# Patient Record
Sex: Male | Born: 1995 | Race: Asian | Hispanic: No | Marital: Single | Smoking: Never smoker
Health system: Southern US, Community
[De-identification: ages and names within clinical notes are randomized; demographics above are authoritative.]

---
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
## 2014-09-24 ENCOUNTER — Encounter: Payer: Self-pay | Admitting: *Deleted

## 2014-09-24 ENCOUNTER — Emergency Department
Admission: EM | Admit: 2014-09-24 | Discharge: 2014-09-24 | Disposition: A | Discharge status: Home / Self Care | Payer: Self-pay | Attending: Emergency Medicine | Admitting: Emergency Medicine

## 2014-09-24 ENCOUNTER — Emergency Department: Payer: Self-pay

## 2014-09-24 DIAGNOSIS — S199XXA Unspecified injury of neck, initial encounter: Secondary | ICD-10-CM | POA: Insufficient documentation

## 2014-09-24 DIAGNOSIS — S99912A Unspecified injury of left ankle, initial encounter: Secondary | ICD-10-CM | POA: Insufficient documentation

## 2014-09-24 DIAGNOSIS — Y92410 Unspecified street and highway as the place of occurrence of the external cause: Secondary | ICD-10-CM | POA: Insufficient documentation

## 2014-09-24 DIAGNOSIS — Y9389 Activity, other specified: Secondary | ICD-10-CM | POA: Insufficient documentation

## 2014-09-24 DIAGNOSIS — Y998 Other external cause status: Secondary | ICD-10-CM | POA: Insufficient documentation

## 2014-09-24 DIAGNOSIS — Z87891 Personal history of nicotine dependence: Secondary | ICD-10-CM | POA: Insufficient documentation

## 2014-09-24 DIAGNOSIS — S4992XA Unspecified injury of left shoulder and upper arm, initial encounter: Secondary | ICD-10-CM | POA: Insufficient documentation

## 2014-09-24 MED ORDER — TRAMADOL HCL 50 MG PO TABS: 50.0000 mg | ORAL_TABLET | Freq: Four times a day (QID) | ORAL | Status: DC | PRN

## 2014-09-24 MED ORDER — METHOCARBAMOL 500 MG PO TABS
1000.0000 mg | ORAL_TABLET | Freq: Once | ORAL | Status: AC
  Administered 2014-09-24: 1000 mg via ORAL

## 2014-09-24 MED ORDER — METHOCARBAMOL 500 MG PO TABS
ORAL_TABLET | ORAL | Status: AC
  Administered 2014-09-24: 1000 mg via ORAL
  Filled 2014-09-24: qty 2

## 2014-09-24 MED ORDER — IBUPROFEN 800 MG PO TABS
ORAL_TABLET | ORAL | Status: AC
  Administered 2014-09-24: 800 mg via ORAL
  Filled 2014-09-24: qty 1

## 2014-09-24 MED ORDER — TRAMADOL HCL 50 MG PO TABS
ORAL_TABLET | ORAL | Status: AC
  Administered 2014-09-24: 50 mg via ORAL
  Filled 2014-09-24: qty 1

## 2014-09-24 MED ORDER — TRAMADOL HCL 50 MG PO TABS
50.0000 mg | ORAL_TABLET | Freq: Once | ORAL | Status: AC
  Administered 2014-09-24: 50 mg via ORAL

## 2014-09-24 MED ORDER — METHOCARBAMOL 750 MG PO TABS: 1500.0000 mg | ORAL_TABLET | Freq: Four times a day (QID) | ORAL | Status: DC

## 2014-09-24 MED ORDER — IBUPROFEN 800 MG PO TABS: 800.0000 mg | ORAL_TABLET | Freq: Three times a day (TID) | ORAL | Status: DC | PRN

## 2014-09-24 MED ORDER — IBUPROFEN 800 MG PO TABS
800.0000 mg | ORAL_TABLET | Freq: Once | ORAL | Status: AC
  Administered 2014-09-24: 800 mg via ORAL
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
## 2014-09-24 NOTE — Discharge Instructions (Signed)

## 2015-03-31 ENCOUNTER — Emergency Department: Payer: Worker's Compensation

## 2015-03-31 ENCOUNTER — Emergency Department
Admission: EM | Admit: 2015-03-31 | Discharge: 2015-03-31 | Disposition: A | Discharge status: Home / Self Care | Payer: Worker's Compensation | Attending: Emergency Medicine | Admitting: Emergency Medicine

## 2015-03-31 ENCOUNTER — Encounter: Payer: Self-pay | Admitting: Emergency Medicine

## 2015-03-31 DIAGNOSIS — Z87891 Personal history of nicotine dependence: Secondary | ICD-10-CM | POA: Diagnosis not present

## 2015-03-31 DIAGNOSIS — Y990 Civilian activity done for income or pay: Secondary | ICD-10-CM | POA: Diagnosis not present

## 2015-03-31 DIAGNOSIS — W208XXA Other cause of strike by thrown, projected or falling object, initial encounter: Secondary | ICD-10-CM | POA: Diagnosis not present

## 2015-03-31 DIAGNOSIS — Y9289 Other specified places as the place of occurrence of the external cause: Secondary | ICD-10-CM | POA: Diagnosis not present

## 2015-03-31 DIAGNOSIS — Z79899 Other long term (current) drug therapy: Secondary | ICD-10-CM | POA: Insufficient documentation

## 2015-03-31 DIAGNOSIS — Y9389 Activity, other specified: Secondary | ICD-10-CM | POA: Diagnosis not present

## 2015-03-31 DIAGNOSIS — S60221A Contusion of right hand, initial encounter: Secondary | ICD-10-CM | POA: Insufficient documentation

## 2015-03-31 DIAGNOSIS — S6991XA Unspecified injury of right wrist, hand and finger(s), initial encounter: Secondary | ICD-10-CM | POA: Diagnosis present

## 2015-03-31 MED ORDER — NAPROXEN 500 MG PO TABS: 500.0000 mg | ORAL_TABLET | Freq: Two times a day (BID) | ORAL | Status: DC

## 2015-03-31 NOTE — ED Notes (Signed)
States he had something drop on to right hand at work  having pain with min swelling to hand

## 2015-03-31 NOTE — ED Notes (Signed)
After patient was dismissed, I was asked by RN Marga HootsShannin had I taken vitals on patient and not recorded them.  I replied that I was not aware that vitals were needed that I was informed that I needed to do a workman's comp.  After searching the patient's labels were found on PA's desk and on the back was written FLEX, HT.,  WT., VS.

## 2015-03-31 NOTE — ED Provider Notes (Signed)
[facility] Emergency Department Provider Note  ____________________________________________  Time seen: Approximately 5:23 PM  I have reviewed the triage vital signs and the nursing notes.   HISTORY  Chief Complaint Hand Pain    HPI Eddie Stephenson is a 19 y.o. male patient complaining of right hand pain for 1 week secondary to contusion which occurred at work. Patient state pain has increased and the swelling is never resolved. Patient denies any loss of sensation. States pain increase with flexion. Patient has used Tylenol, Motrin, and has applied ice to the area.Patient rated his pain as a 4/10.   History reviewed. No pertinent past medical history.  There are no active problems to display for this patient.   History reviewed. No pertinent past surgical history.  Current Outpatient Rx  Name  Route  Sig  Dispense  Refill  . ibuprofen (ADVIL,MOTRIN) 800 MG tablet   Oral   Take 1 tablet (800 mg total) by mouth every 8 (eight) hours as needed for moderate pain.   15 tablet   0   . methocarbamol (ROBAXIN-750) 750 MG tablet   Oral   Take 2 tablets (1,500 mg total) by mouth 4 (four) times daily.   40 tablet   0   . traMADol (ULTRAM) 50 MG tablet   Oral   Take 1 tablet (50 mg total) by mouth every 6 (six) hours as needed for moderate pain.   12 tablet   0     Allergies Review of patient's allergies indicates no known allergies.  No family history on file.  Social History Social History  Substance Use Topics  . Smoking status: Former Games developer  . Smokeless tobacco: None  . Alcohol Use: No    Review of Systems Constitutional: No fever/chills Eyes: No visual changes. ENT: No sore throat. Cardiovascular: Denies chest pain. Respiratory: Denies shortness of breath. Gastrointestinal: No abdominal pain.  No nausea, no vomiting.  No diarrhea.  No constipation. Genitourinary: Negative for dysuria. Musculoskeletal: Negative for back pain. Skin:  Negative for rash. Neurological: Negative for headaches, focal weakness or numbness.  10-point ROS otherwise negative.  ____________________________________________   PHYSICAL EXAM:  VITAL SIGNS: ED Triage Vitals  Enc Vitals Group     BP --      Pulse --      Resp --      Temp --      Temp src --      SpO2 --      Weight --      Height --      Head Cir --      Peak Flow --      Pain Score 03/31/15 1655 4     Pain Loc --      Pain Edu? --      Excl. in GC? --     Constitutional: Alert and oriented. Well appearing and in no acute distress. Eyes: Conjunctivae are normal. PERRL. EOMI. Head: Atraumatic. Nose: No congestion/rhinnorhea. Mouth/Throat: Mucous membranes are moist.  Oropharynx non-erythematous. Neck: No stridor.  No cervical spine tenderness to palpation. Hematological/Lymphatic/Immunilogical: No cervical lymphadenopathy. Cardiovascular: Normal rate, regular rhythm. Grossly normal heart sounds.  Good peripheral circulation. Respiratory: Normal respiratory effort.  No retractions. Lungs CTAB. Gastrointestinal: Soft and nontender. No distention. No abdominal bruits. No CVA tenderness. Musculoskeletal: No lower extremity tenderness nor edema.  No joint effusions. Neurologic:  Normal speech and language. No gross focal neurologic deficits are appreciated. No gait instability. Skin:  Skin is warm, dry  and intact. No rash noted. Psychiatric: Mood and affect are normal. Speech and behavior are normal.  ____________________________________________   LABS (all labs ordered are listed, but only abnormal results are displayed)  Labs Reviewed - No data to display ____________________________________________  EKG   ____________________________________________  RADIOLOGY  No acute findings on x-ray of the right hand. ____________________________________________   PROCEDURES  Procedure(s) performed: None  Critical Care performed:  No  ____________________________________________   INITIAL IMPRESSION / ASSESSMENT AND PLAN / ED COURSE  Pertinent labs & imaging results that were available during my care of the patient were reviewed by me and considered in my medical decision making (see chart for details).  Right hand contusion. Discussed negative x-ray findings with the patient. Patient given home care instructions and a prescription for naproxen take as directed. Patient advised follow-up with family doctor as needed. ____________________________________________   FINAL CLINICAL IMPRESSION(S) / ED DIAGNOSES  Final diagnoses:  Hand contusion, right, initial encounter      Joni ReiningRonald K Raequon Catanzaro, PA-C 03/31/15 1802  Sharyn CreamerMark Quale, MD 03/31/15 [phone]

## 2015-03-31 NOTE — Discharge Instructions (Signed)
Hand Contusion ° A hand contusion is a deep bruise to the hand. Contusions happen when an injury causes bleeding under the skin. Signs of bruising include pain, puffiness (swelling), and discolored skin. The contusion may turn blue, purple, or yellow. °HOME CARE °· Put ice on the injured area. °¨ Put ice in a plastic bag. °¨ Place a towel between your skin and the bag. °¨ Leave the ice on for 15-20 minutes, 03-04 times a day. °· Only take medicines as told by your doctor. °· Use an elastic wrap only as told. You may remove the wrap for sleeping, showering, and bathing. Take the wrap off if you lose feeling (have numbness) in your fingers, or they turn blue or cold. Put the wrap on more loosely. °· Keep the hand raised (elevated) with pillows. °· Avoid using your hand too much if it is painful. °GET HELP RIGHT AWAY IF:  °· You have more redness, puffiness, or pain in your hand. °· Your puffiness or pain does not get better with medicine. °· You lose feeling in your hand, or you cannot move your fingers. °· Your hand turns cold or blue. °· You have pain when you move your fingers. °· Your hand feels warm. °· Your contusion does not get better in 2 days. °MAKE SURE YOU:  °· Understand these instructions. °· Will watch this condition. °· Will get help right away if you are not doing well or you get worse. °  °This information is not intended to replace advice given to you by your health care provider. Make sure you discuss any questions you have with your health care provider. °  °Document Released: 09/15/2007 Document Revised: 04/19/2014 Document Reviewed: 09/20/2011 °Elsevier Interactive Patient Education ©2016 [employer] Inc[employer] ° °

## 2015-03-31 NOTE — ED Notes (Signed)
After patient was discharged I was asked by RN Marga HootsShannin had I gotten vitals on Patient.  I told her I was not aware vitals were were not taken  I was informed that a workman's comp was to be done.  In looking to see if they had been done and not recorded on the PA's desk were patient's stickers and on the back was written, Flex, HT. WT. VS.

## 2015-04-21 ENCOUNTER — Encounter: Payer: Self-pay | Admitting: Emergency Medicine

## 2015-04-21 ENCOUNTER — Emergency Department
Admission: EM | Admit: 2015-04-21 | Discharge: 2015-04-21 | Disposition: A | Discharge status: Home / Self Care | Payer: Self-pay | Attending: Emergency Medicine | Admitting: Emergency Medicine

## 2015-04-21 DIAGNOSIS — X501XXA Overexertion from prolonged static or awkward postures, initial encounter: Secondary | ICD-10-CM | POA: Insufficient documentation

## 2015-04-21 DIAGNOSIS — Z87891 Personal history of nicotine dependence: Secondary | ICD-10-CM | POA: Insufficient documentation

## 2015-04-21 DIAGNOSIS — Y9289 Other specified places as the place of occurrence of the external cause: Secondary | ICD-10-CM | POA: Insufficient documentation

## 2015-04-21 DIAGNOSIS — Y9389 Activity, other specified: Secondary | ICD-10-CM | POA: Insufficient documentation

## 2015-04-21 DIAGNOSIS — Y990 Civilian activity done for income or pay: Secondary | ICD-10-CM | POA: Insufficient documentation

## 2015-04-21 DIAGNOSIS — S39012A Strain of muscle, fascia and tendon of lower back, initial encounter: Secondary | ICD-10-CM | POA: Insufficient documentation

## 2015-04-21 MED ORDER — METHOCARBAMOL 500 MG PO TABS: 500.0000 mg | ORAL_TABLET | Freq: Four times a day (QID) | ORAL | Status: DC

## 2015-04-21 MED ORDER — MELOXICAM 15 MG PO TABS: 15.0000 mg | ORAL_TABLET | Freq: Every day | ORAL | Status: DC

## 2015-04-21 NOTE — Discharge Instructions (Signed)
Back Injury Prevention °Back injuries can be very painful. They can also be difficult to heal. After having one back injury, you are more likely to injure your back again. It is important to learn how to avoid injuring or re-injuring your back. The following tips can help you to prevent a back injury. °WHAT SHOULD I KNOW ABOUT PHYSICAL FITNESS? °· Exercise for 30 minutes per day on most days of the week or as directed by your health care provider. Make sure to: °· Do aerobic exercises, such as walking, jogging, biking, or swimming. °· Do exercises that increase balance and strength, such as tai chi and yoga. These can decrease your risk of falling and injuring your back. °· Do stretching exercises to help with flexibility. °· Try to develop strong abdominal muscles. Your abdominal muscles provide a lot of the support that is needed by your back. °· Maintain a healthy weight.  This helps to decrease your risk of a back injury. °WHAT SHOULD I KNOW ABOUT MY DIET? °· Talk with your health care provider about your overall diet. Take supplements and vitamins only as directed by your health care provider. °· Talk with your health care provider about how much calcium and vitamin D you need each day. These nutrients help to prevent weakening of the bones (osteoporosis). Osteoporosis can cause broken (fractured) bones, which lead to back pain. °· Include good sources of calcium in your diet, such as dairy products, green leafy vegetables, and products that have had calcium added to them (fortified). °· Include good sources of vitamin D in your diet, such as milk and foods that are fortified with vitamin D. °WHAT SHOULD I KNOW ABOUT MY POSTURE? °· Sit up straight and stand up straight. Avoid leaning forward when you sit or hunching over when you stand. °· Choose chairs that have good low-back (lumbar) support. °· If you work at a desk, sit close to it so you do not need to lean over. Keep your chin tucked in. Keep your neck  drawn back, and keep your elbows bent at a right angle. Your arms should look like the letter "L." °· Sit high and close to the steering wheel when you drive. Add a lumbar support to your car seat, if needed. °· Avoid sitting or standing in one position for very long. Take breaks to get up, stretch, and walk around at least one time every hour. Take breaks every hour if you are driving for long periods of time. °· Sleep on your side with your knees slightly bent, or sleep on your back with a pillow under your knees. Do not lie on the front of your body to sleep. °WHAT SHOULD I KNOW ABOUT LIFTING, TWISTING, AND REACHING? °Lifting and Heavy Lifting °· Avoid heavy lifting, especially repetitive heavy lifting. If you must do heavy lifting: °· Stretch before lifting. °· Work slowly. °· Rest between lifts. °· Use a tool such as a cart or a dolly to move objects if one is available. °· Make several small trips instead of carrying one heavy load. °· Ask for help when you need it, especially when moving big objects. °· Follow these steps when lifting: °· Stand with your feet shoulder-width apart. °· Get as close to the object as you can. Do not try to pick up a heavy object that is far from your body. °· Use handles or lifting straps if they are available. °· Bend at your knees. Squat down, but keep your heels off the floor. °·   Keep your shoulders pulled back, your chin tucked in, and your back straight. °· Lift the object slowly while you tighten the muscles in your legs, abdomen, and buttocks. Keep the object as close to the center of your body as possible. °· Follow these steps when putting down a heavy load: °· Stand with your feet shoulder-width apart. °· Lower the object slowly while you tighten the muscles in your legs, abdomen, and buttocks. Keep the object as close to the center of your body as possible. °· Keep your shoulders pulled back, your chin tucked in, and your back straight. °· Bend at your knees. Squat  down, but keep your heels off the floor. °· Use handles or lifting straps if they are available. °Twisting and Reaching °· Avoid lifting heavy objects above your waist. °· Do not twist at your waist while you are lifting or carrying a load. If you need to turn, move your feet. °· Do not bend over without bending at your knees. °· Avoid reaching over your head, across a table, or for an object on a high surface. °WHAT ARE SOME OTHER TIPS? °· Avoid wet floors and icy ground. Keep sidewalks clear of ice to prevent falls. °· Do not sleep on a mattress that is too soft or too hard. °· Keep items that are used frequently within easy reach. °· Put heavier objects on shelves at waist level, and put lighter objects on lower or higher shelves. °· Find ways to decrease your stress, such as exercise, massage, or relaxation techniques. Stress can build up in your muscles. Tense muscles are more vulnerable to injury. °· Talk with your health care provider if you feel anxious or depressed. These conditions can make back pain worse. °· Wear flat heel shoes with cushioned soles. °· Avoid sudden movements. °· Use both shoulder straps when carrying a backpack. °· Do not use any tobacco products, including cigarettes, chewing tobacco, or electronic cigarettes. If you need help quitting, ask your health care provider. °  °This information is not intended to replace advice given to you by your health care provider. Make sure you discuss any questions you have with your health care provider. °  °Document Released: 05/06/2004 Document Revised: 08/13/2014 Document Reviewed: 04/02/2014 °Elsevier Interactive Patient Education ©2016 [employer] Inc[employer] ° °Lumbosacral Strain °Lumbosacral strain is a strain of any of the parts that make up your lumbosacral vertebrae. Your lumbosacral vertebrae are the bones that make up the lower third of your backbone. Your lumbosacral vertebrae are held together by muscles and tough, fibrous tissue (ligaments).    °CAUSES  °A sudden blow to your back can cause lumbosacral strain. Also, anything that causes an excessive stretch of the muscles in the low back can cause this strain. This is typically seen when people exert themselves strenuously, fall, lift heavy objects, bend, or crouch repeatedly. °RISK FACTORS °· Physically demanding work. °· Participation in pushing or pulling sports or sports that require a sudden twist of the back (tennis, golf, baseball). °· Weight lifting. °· Excessive lower back curvature. °· Forward-tilted pelvis. °· Weak back or abdominal muscles or both. °· Tight hamstrings. °SIGNS AND SYMPTOMS  °Lumbosacral strain may cause pain in the area of your injury or pain that moves (radiates) down your leg.  °DIAGNOSIS °Your health care provider can often diagnose lumbosacral strain through a physical exam. In some cases, you may need tests such as X-ray exams.  °TREATMENT  °Treatment for your lower back injury depends on many factors that   your clinician will have to evaluate. However, most treatment will include the use of anti-inflammatory medicines. °HOME CARE INSTRUCTIONS  °· Avoid hard physical activities (tennis, racquetball, waterskiing) if you are not in proper physical condition for it. This may aggravate or create problems. °· If you have a back problem, avoid sports requiring sudden body movements. Swimming and walking are generally safer activities. °· Maintain good posture. °· Maintain a healthy weight. °· For acute conditions, you may put ice on the injured area. °¨ Put ice in a plastic bag. °¨ Place a towel between your skin and the bag. °¨ Leave the ice on for 20 minutes, 2-3 times a day. °· When the low back starts healing, stretching and strengthening exercises may be recommended. °SEEK MEDICAL CARE IF: °· Your back pain is getting worse. °· You experience severe back pain not relieved with medicines. °SEEK IMMEDIATE MEDICAL CARE IF:  °· You have numbness, tingling, weakness, or problems  with the use of your arms or legs. °· There is a change in bowel or bladder control. °· You have increasing pain in any area of the body, including your belly (abdomen). °· You notice shortness of breath, dizziness, or feel faint. °· You feel sick to your stomach (nauseous), are throwing up (vomiting), or become sweaty. °· You notice discoloration of your toes or legs, or your feet get very cold. °MAKE SURE YOU:  °· Understand these instructions. °· Will watch your condition. °· Will get help right away if you are not doing well or get worse. °  °This information is not intended to replace advice given to you by your health care provider. Make sure you discuss any questions you have with your health care provider. °  °Document Released: 01/06/2005 Document Revised: 04/19/2014 Document Reviewed: 11/15/2012 °Elsevier Interactive Patient Education ©2016 [employer] Inc. ° °

## 2015-04-21 NOTE — ED Notes (Signed)
Pt to ed with c/o lower back pain that started after he picked up an object at work.  Pt states increased pain with deep breaths and increased pain with movement.

## 2015-04-21 NOTE — ED Notes (Signed)
Lower back pain after lifting something heavy  Ambulates well to treatment area

## 2015-04-21 NOTE — ED Provider Notes (Signed)
[facility] Emergency Department Provider Note ?  ? ____________________________________________ ? Time seen: 7:29 PM ? I have reviewed the triage vital signs and the nursing notes.  ________ HISTORY ? Chief Complaint Back Pain     HPI  Eddie Stephenson is a 20 y.o. male   who presents emergency department with bilateral lower back pain. He states that he was lifting an object no more than 5-10 pounds at work when he twisted and felt a sharp sensation in his lower back. He states initially the pain took away his breath. He denies any other injury. He denies any radicular symptoms. He denies any bowel or bladder dysfunction. He denies saddle anesthesia. Eyes any previous history of injuries to his back. ? ? ? History reviewed. No pertinent past medical history.  There are no active problems to display for this patient.  ? History reviewed. No pertinent past surgical history. ? Current Outpatient Rx  Name  Route  Sig  Dispense  Refill  . meloxicam (MOBIC) 15 MG tablet   Oral   Take 1 tablet (15 mg total) by mouth daily.   30 tablet   0   . methocarbamol (ROBAXIN) 500 MG tablet   Oral   Take 1 tablet (500 mg total) by mouth 4 (four) times daily.   16 tablet   0    ? Allergies Review of patient's allergies indicates no known allergies. ? History reviewed. No pertinent family history. ? Social History Social History  Substance Use Topics  . Smoking status: Former Games developer  . Smokeless tobacco: None  . Alcohol Use: No   ? Review of Systems Constitutional: no fever. Eyes: no discharge ENT: no sore throat. Cardiovascular: no chest pain. Respiratory: no cough. No sob Gastrointestinal: denies abdominal pain, vomiting, diarrhea, and constipation Genitourinary: no dysuria. Negative for hematuria Musculoskeletal: Positive for lower back pain. Skin: Negative for rash. Neurological: Negative for headaches  10-point ROS otherwise  negative.  _______________ PHYSICAL EXAM: ? VITAL SIGNS:   ED Triage Vitals  Enc Vitals Group     BP 04/21/15 1839 137/92 mmHg     Pulse Rate 04/21/15 1839 81     Resp 04/21/15 1839 18     Temp 04/21/15 1839 98.2 F (36.8 C)     Temp Source 04/21/15 1839 Oral     SpO2 04/21/15 1839 98 %     Weight 04/21/15 1839 185 lb (83.915 kg)     Height 04/21/15 1839 6' (1.829 m)     Head Cir --      Peak Flow --      Pain Score 04/21/15 1829 8     Pain Loc --      Pain Edu? --      Excl. in GC? --    ?  Constitutional: Alert and oriented. Well appearing and in no distress. Eyes: Conjunctivae are normal.  ENT      Head: Normocephalic and atraumatic.      Ears:       Nose: No congestion/rhinnorhea.      Mouth/Throat: Mucous membranes are moist.   Hematological/Lymphatic/Immunilogical: No cervical lymphadenopathy. Cardiovascular: Normal rate, regular rhythm. Normal S1 and S2. Respiratory: Normal respiratory effort without tachypnea nor retractions. Lungs CTAB. Gastrointestinal: Soft and nontender. No distention. There is no CVA tenderness. Genitourinary:  Musculoskeletal: Nontender with normal range of motion in all extremities. No visible deformity to lower back upon inspection. Patient is nontender to palpation midline spinal processes. Patient is tender to palpation  bilateral paraspinal muscle groups in the lumbar region. Negative straight leg raise bilaterally. Sensation is intact and equal lower extremities. Dorsalis pedis pulses palpated bilaterally.  Neurologic:  Normal speech and language. No gross focal neurologic deficits are appreciated. Skin:  Skin is warm, dry and intact. No rash noted. Psychiatric: Mood and affect are normal. Speech and behavior are normal. Patient exhibits appropriate insight and judgment.    LABS (all labs ordered are listed, but only abnormal results are displayed)  Labs Reviewed - No data to  display  ___________ RADIOLOGY    _____________ PROCEDURES ? Procedure(s) performed:    Medications - No data to display  ______________________________________________________ INITIAL IMPRESSION / ASSESSMENT AND PLAN / ED COURSE ? Pertinent labs & imaging results that were available during my care of the patient were reviewed by me and considered in my medical decision making (see chart for details).    Patient's diagnosis is consistent with lumbar paraspinal muscle strain. Patient will be given anti-inflammatories and muscle relaxers for symptom control. Patient will follow-up with primary care or orthopedics should symptoms persist past this treatment course.    New Prescriptions   MELOXICAM (MOBIC) 15 MG TABLET    Take 1 tablet (15 mg total) by mouth daily.   METHOCARBAMOL (ROBAXIN) 500 MG TABLET    Take 1 tablet (500 mg total) by mouth 4 (four) times daily.   ____________________________________________ FINAL CLINICAL IMPRESSION(S) / ED DIAGNOSES?  Final diagnoses:  Strain of lumbar paraspinal muscle, initial encounter            Racheal PatchesJonathan D Derriona Branscom, PA-C 04/21/15 1929  Governor Rooksebecca Lord, MD 04/21/15 [phone]

## 2018-03-23 DIAGNOSIS — Y929 Unspecified place or not applicable: Secondary | ICD-10-CM | POA: Insufficient documentation

## 2018-03-23 DIAGNOSIS — S81811A Laceration without foreign body, right lower leg, initial encounter: Secondary | ICD-10-CM | POA: Insufficient documentation

## 2018-03-23 DIAGNOSIS — W25XXXA Contact with sharp glass, initial encounter: Secondary | ICD-10-CM | POA: Insufficient documentation

## 2018-03-23 DIAGNOSIS — Z79899 Other long term (current) drug therapy: Secondary | ICD-10-CM | POA: Insufficient documentation

## 2018-03-23 DIAGNOSIS — Z87891 Personal history of nicotine dependence: Secondary | ICD-10-CM | POA: Insufficient documentation

## 2018-03-23 DIAGNOSIS — Y9301 Activity, walking, marching and hiking: Secondary | ICD-10-CM | POA: Insufficient documentation

## 2018-03-23 DIAGNOSIS — Y999 Unspecified external cause status: Secondary | ICD-10-CM | POA: Insufficient documentation

## 2018-03-23 DIAGNOSIS — Z23 Encounter for immunization: Secondary | ICD-10-CM | POA: Insufficient documentation

## 2018-03-24 ENCOUNTER — Emergency Department (HOSPITAL_COMMUNITY)
Admission: EM | Admit: 2018-03-24 | Discharge: 2018-03-24 | Disposition: A | Discharge status: Home / Self Care | Payer: Self-pay | Attending: Emergency Medicine | Admitting: Emergency Medicine

## 2018-03-24 ENCOUNTER — Other Ambulatory Visit: Payer: Self-pay

## 2018-03-24 ENCOUNTER — Encounter (HOSPITAL_COMMUNITY): Payer: Self-pay | Admitting: Emergency Medicine

## 2018-03-24 DIAGNOSIS — S81811A Laceration without foreign body, right lower leg, initial encounter: Secondary | ICD-10-CM

## 2018-03-24 MED ORDER — TETANUS-DIPHTH-ACELL PERTUSSIS 5-2.5-18.5 LF-MCG/0.5 IM SUSP
0.5000 mL | Freq: Once | INTRAMUSCULAR | Status: AC
  Administered 2018-03-24: 0.5 mL via INTRAMUSCULAR
  Filled 2018-03-24: qty 0.5

## 2018-03-24 MED ORDER — BACITRACIN ZINC 500 UNIT/GM EX OINT
TOPICAL_OINTMENT | CUTANEOUS | Status: AC
  Administered 2018-03-24: 02:00:00
  Filled 2018-03-24: qty 0.9

## 2018-03-24 MED ORDER — LIDOCAINE-EPINEPHRINE (PF) 2 %-1:200000 IJ SOLN
10.0000 mL | Freq: Once | INTRAMUSCULAR | Status: AC
  Administered 2018-03-24: 10 mL
  Filled 2018-03-24: qty 10

## 2018-03-24 NOTE — ED Provider Notes (Signed)
[facility]-EMERGENCY DEPT Provider Note   CSN: [id number] Arrival date & time: 03/23/18  2349     History   Chief Complaint Chief Complaint  Patient presents with  . Laceration    HPI Eddie Stephenson is a 22 y.o. male.  Patient to ED with laceration to lateral right knee. He reports he walked by a broken glass table and accidentally hit a ;iece of the glass to the leg, causing a large laceration. He denies numbness or any weakness in the LE.   The history is provided by the patient. No language interpreter was used.  Laceration      No past medical history on file.  There are no active problems to display for this patient.   No past surgical history on file.      Home Medications    Prior to Admission medications   Medication Sig Start Date End Date Taking? Authorizing Provider  meloxicam (MOBIC) 15 MG tablet Take 1 tablet (15 mg total) by mouth daily. 04/21/15   Cuthriell, Delorise RoyalsJonathan D, PA-C  methocarbamol (ROBAXIN) 500 MG tablet Take 1 tablet (500 mg total) by mouth 4 (four) times daily. 04/21/15   Cuthriell, Delorise RoyalsJonathan D, PA-C    Family History No family history on file.  Social History Social History   Tobacco Use  . Smoking status: Former Smoker  Substance Use Topics  . Alcohol use: No  . Drug use: Not on file     Allergies   Patient has no known allergies.   Review of Systems Review of Systems  Constitutional: Negative for diaphoresis.  Gastrointestinal: Negative for nausea.  Musculoskeletal:       See HPI.  Skin: Positive for wound.  Neurological: Negative for weakness and numbness.     Physical Exam Updated Vital Signs BP 122/90 (BP Location: Right Arm)   Pulse 90   Temp 98.2 F (36.8 C) (Oral)   Resp 15   Ht 6' (1.829 m)   Wt 88.5 kg   SpO2 99%   BMI 26.45 kg/m   Physical Exam Constitutional:      Appearance: He is well-developed.  Neck:     Musculoskeletal: Normal range of motion.  Pulmonary:     Effort:  Pulmonary effort is normal.  Musculoskeletal: Normal range of motion.     Comments: FROM right LE with full strength.  Skin:    General: Skin is warm and dry.     Comments: 5 cm linear, full thickness laceration lateral right knee. No exposed musculature.   Neurological:     Mental Status: He is alert and oriented to person, place, and time.      ED Treatments / Results  Labs (all labs ordered are listed, but only abnormal results are displayed) Labs Reviewed - No data to display  EKG None  Radiology No results found.  Procedures .Marland Kitchen.Laceration Repair Date/Time: 03/24/2018 1:09 AM Performed by: Elpidio AnisUpstill, Kylani Wires, PA-C Authorized by: Elpidio AnisUpstill, Tayon Parekh, PA-C   Consent:    Consent obtained:  Verbal   Consent given by:  Patient Anesthesia (see MAR for exact dosages):    Anesthesia method:  Local infiltration   Local anesthetic:  Lidocaine 2% WITH epi Laceration details:    Location:  Leg   Leg location:  R knee   Length (cm):  5 Repair type:    Repair type:  Simple Exploration:    Hemostasis achieved with:  Direct pressure   Wound exploration: wound explored through full range of motion and  entire depth of wound probed and visualized     Wound extent: no foreign bodies/material noted     Contaminated: no   Treatment:    Area cleansed with:  Betadine and saline   Amount of cleaning:  Standard Skin repair:    Repair method:  Staples   Number of staples:  8 Approximation:    Approximation:  Close Post-procedure details:    Patient tolerance of procedure:  Tolerated well, no immediate complications   (including critical care time)  Medications Ordered in ED Medications  lidocaine-EPINEPHrine (XYLOCAINE W/EPI) 2 %-1:200000 (PF) injection 10 mL (10 mLs Infiltration Given 03/24/18 0103)     Initial Impression / Assessment and Plan / ED Course  I have reviewed the triage vital signs and the nursing notes.  Pertinent labs & imaging results that were available during my  care of the patient were reviewed by me and considered in my medical decision making (see chart for details).     Uncomplicated laceration to right lower extremity, repaired as per above note. Wound explored in its entirety. I do not feel there is a risk of glass FB. Tetanus updated.  Final Clinical Impressions(s) / ED Diagnoses   Final diagnoses:  None   1. Right leg laceration  ED Discharge Orders    None       Elpidio Anis, PA-C 03/24/18 0111    Derwood Kaplan, MD 03/24/18 [phone]

## 2018-03-24 NOTE — Discharge Instructions (Signed)
Return in 10 days to have staples removed. Return sooner with any sign of infection. Tylenol and/or ibuprofen for any discomfort.
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
## 2018-03-24 NOTE — ED Notes (Signed)
Bed: WTR7 Expected date:  Expected time:  Means of arrival:  Comments: 

## 2018-11-08 ENCOUNTER — Other Ambulatory Visit: Payer: Self-pay

## 2018-11-08 DIAGNOSIS — Z20822 Contact with and (suspected) exposure to covid-19: Secondary | ICD-10-CM

## 2018-11-09 LAB — NOVEL CORONAVIRUS, NAA: SARS-CoV-2, NAA: DETECTED — AB

## 2019-07-14 ENCOUNTER — Emergency Department (HOSPITAL_COMMUNITY): Payer: No Typology Code available for payment source

## 2019-07-14 ENCOUNTER — Encounter (HOSPITAL_COMMUNITY): Payer: Self-pay | Admitting: Emergency Medicine

## 2019-07-14 ENCOUNTER — Emergency Department (HOSPITAL_COMMUNITY)
Admission: EM | Admit: 2019-07-14 | Discharge: 2019-07-14 | Disposition: A | Discharge status: Home / Self Care | Payer: No Typology Code available for payment source | Attending: Emergency Medicine | Admitting: Emergency Medicine

## 2019-07-14 ENCOUNTER — Other Ambulatory Visit: Payer: Self-pay

## 2019-07-14 DIAGNOSIS — Y92410 Unspecified street and highway as the place of occurrence of the external cause: Secondary | ICD-10-CM | POA: Insufficient documentation

## 2019-07-14 DIAGNOSIS — Y999 Unspecified external cause status: Secondary | ICD-10-CM | POA: Insufficient documentation

## 2019-07-14 DIAGNOSIS — R110 Nausea: Secondary | ICD-10-CM | POA: Diagnosis not present

## 2019-07-14 DIAGNOSIS — M549 Dorsalgia, unspecified: Secondary | ICD-10-CM

## 2019-07-14 DIAGNOSIS — M545 Low back pain: Secondary | ICD-10-CM | POA: Insufficient documentation

## 2019-07-14 DIAGNOSIS — M546 Pain in thoracic spine: Secondary | ICD-10-CM | POA: Insufficient documentation

## 2019-07-14 DIAGNOSIS — R4182 Altered mental status, unspecified: Secondary | ICD-10-CM | POA: Insufficient documentation

## 2019-07-14 DIAGNOSIS — M542 Cervicalgia: Secondary | ICD-10-CM | POA: Insufficient documentation

## 2019-07-14 DIAGNOSIS — Y9389 Activity, other specified: Secondary | ICD-10-CM | POA: Insufficient documentation

## 2019-07-14 MED ORDER — ONDANSETRON 4 MG PO TBDP
4.0000 mg | ORAL_TABLET | Freq: Once | ORAL | Status: AC
  Administered 2019-07-14: 4 mg via ORAL
  Filled 2019-07-14: qty 1
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
## 2019-07-14 NOTE — Discharge Instructions (Addendum)
Take ibuprofen 3 times a day with meals.  Do not take other anti-inflammatories at the same time (Advil, Motrin, naproxen, Aleve). You may supplement with Tylenol if you need further pain control. Use muscle creams such as Salonpas, icy hot, BenGay, Biofreeze, to help with muscle stiffness/soreness. Use ice packs or heating pads if this helps control your pain. You will likely have continued muscle stiffness and soreness over the next couple days.  Follow-up with primary care in 1 week if your symptoms are not improving. Return to the emergency room if you develop vision changes, vomiting, slurred speech, numbness, loss of bowel or bladder control, or any new or worsening symptoms.
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
## 2019-07-14 NOTE — ED Notes (Signed)
PA at bedside.

## 2021-01-06 ENCOUNTER — Emergency Department: Admission: EM | Admit: 2021-01-06 | Discharge: 2021-01-06 | Discharge status: Against Medical Advice | Payer: Self-pay

## 2021-01-06 NOTE — ED Notes (Signed)
No answer when called several times from lobby 

## 2021-04-15 ENCOUNTER — Encounter: Payer: Self-pay | Admitting: Emergency Medicine

## 2021-04-15 ENCOUNTER — Emergency Department: Payer: Self-pay

## 2021-04-15 ENCOUNTER — Other Ambulatory Visit: Payer: Self-pay

## 2021-04-15 ENCOUNTER — Emergency Department
Admission: EM | Admit: 2021-04-15 | Discharge: 2021-04-15 | Disposition: A | Discharge status: Home / Self Care | Payer: Self-pay | Attending: Emergency Medicine | Admitting: Emergency Medicine

## 2021-04-15 DIAGNOSIS — W108XXA Fall (on) (from) other stairs and steps, initial encounter: Secondary | ICD-10-CM | POA: Insufficient documentation

## 2021-04-15 DIAGNOSIS — W19XXXA Unspecified fall, initial encounter: Secondary | ICD-10-CM

## 2021-04-15 DIAGNOSIS — S5001XA Contusion of right elbow, initial encounter: Secondary | ICD-10-CM | POA: Insufficient documentation

## 2021-04-15 DIAGNOSIS — Y9302 Activity, running: Secondary | ICD-10-CM | POA: Insufficient documentation

## 2021-04-15 NOTE — ED Provider Notes (Signed)
[facility] Provider Note    None    (approximate)   History   Fall   HPI  Eddie Stephenson is a 26 y.o. male  running down steps this am and slipped on wet steps.  Injury to right elbow.  No head injury or LOC.  Patient states he has missed most of his shift today but needs a note to return to work tomorrow.  Currently he denies any pain to his elbow.  Patient denies any medical problems and takes no routine medications.  He rates his pain as a 0/10.      Physical Exam   Triage Vital Signs: ED Triage Vitals [04/15/21 0848]  Enc Vitals Group     BP (!) 122/94     Pulse Rate 88     Resp 16     Temp 98.3 F (36.8 C)     Temp Source Oral     SpO2 100 %     Weight 199 lb 15.3 oz (90.7 kg)     Height 6' (1.829 m)     Head Circumference      Peak Flow      Pain Score 0     Pain Loc      Pain Edu?      Excl. in GC?     Most recent vital signs: Vitals:   04/15/21 0848  BP: (!) 122/94  Pulse: 88  Resp: 16  Temp: 98.3 F (36.8 C)  SpO2: 100%     General: Awake, no distress.  Ambulatory without any assistance. CV:  Good peripheral perfusion.  Radial pulse right upper extremity is present.  Heart regular rate and rhythm. Resp:  Normal effort.  Lungs are clear bilaterally. Abd:  No distention.  Other:  On examination of the right upper extremity there is no gross deformity.  Patient is able to abduct completely without any difficulty.  There is some tenderness on palpation of the olecranon but no soft tissue edema or discoloration is noted.  Skin is intact.  Range of motion is slow and guarded secondary to discomfort but patient is able to flex, extend and rotate.  Digits distally motor sensory function intact.  Capillary refill is less than 3 seconds.   ED Results / Procedures / Treatments   Labs (all labs ordered are listed, but only abnormal results are displayed) Labs Reviewed - No data to display   RADIOLOGY X-ray of the right elbow is  negative for fracture.  X-ray was reviewed by me and also the radiology report.    PROCEDURES:  Critical Care performed: No  Procedures   MEDICATIONS ORDERED IN ED: Medications - No data to display   IMPRESSION / MDM / ASSESSMENT AND PLAN / ED COURSE  I reviewed the triage vital signs and the nursing notes.                              Differential diagnosis includes, but is not limited to fracture right elbow, fracture forearm, dislocated right elbow, contusion right elbow.   26 year old male presents to the ED with complaint of a fall with injury to his right elbow that occurred this morning while he was going to work.  He he attributes his fall to walking down the wet steps due to rain and a pair of crocs.  His concern is injury to his elbow and denies any head injury or loss of consciousness.  Minimal tenderness on palpation of the right olecranon without soft tissue injury or edema.  Range of motion is slow and guarded.  No other physical findings.  X-rays were reviewed by myself and also radiology report was read.  Patient was made aware that there is no fracture.  An Ace wrap was applied by this provider.  Patient was given a note to return to work tomorrow.  He is encouraged to ice and elevate as needed today for pain and it any swelling occurs.  He also has ibuprofen at home which she is instructed to take with food.  He will follow-up with his PCP if any continued problems or Dr. Signa Kell who is on-call for orthopedics.       FINAL CLINICAL IMPRESSION(S) / ED DIAGNOSES   Final diagnoses:  Contusion of right elbow, initial encounter  Fall in home, initial encounter     Rx / DC Orders   ED Discharge Orders     None        Note:  This document was prepared using Dragon voice recognition software and may include unintentional dictation errors.   Tommi Rumps, PA-C 04/15/21 1008    Jene Every, MD 04/15/21 1055

## 2021-04-15 NOTE — Discharge Instructions (Signed)
Follow-up with Dr. Signa Kell if any continued problems.  He is the orthopedist on-call for today.  Take ibuprofen 3-4 times per day with food as needed for pain and inflammation.  Use ice packs frequently.

## 2021-04-15 NOTE — ED Triage Notes (Signed)
Pt comes into the ED via POV c/o fall today where he slipped.  Pt states he landed straight on his back.  Pt denies any current back pain, but states he landed on the right elbow as well and that is what is hurting.  Pt has full mobility in the arm at this time and no deformity noted.

## 2021-11-10 ENCOUNTER — Emergency Department (HOSPITAL_COMMUNITY): Payer: Self-pay

## 2021-11-10 ENCOUNTER — Encounter (HOSPITAL_COMMUNITY): Payer: Self-pay | Admitting: Emergency Medicine

## 2021-11-10 ENCOUNTER — Emergency Department (HOSPITAL_COMMUNITY)
Admission: EM | Admit: 2021-11-10 | Discharge: 2021-11-10 | Disposition: A | Discharge status: Home / Self Care | Payer: Self-pay | Attending: Emergency Medicine | Admitting: Emergency Medicine

## 2021-11-10 ENCOUNTER — Other Ambulatory Visit: Payer: Self-pay

## 2021-11-10 DIAGNOSIS — M7989 Other specified soft tissue disorders: Secondary | ICD-10-CM | POA: Insufficient documentation

## 2021-11-10 DIAGNOSIS — M79641 Pain in right hand: Secondary | ICD-10-CM | POA: Insufficient documentation

## 2021-11-10 LAB — CBC WITH DIFFERENTIAL/PLATELET
Abs Immature Granulocytes: 0.04 10*3/uL (ref 0.00–0.07)
Basophils Absolute: 0.1 10*3/uL (ref 0.0–0.1)
Basophils Relative: 1 %
Eosinophils Absolute: 0.1 10*3/uL (ref 0.0–0.5)
Eosinophils Relative: 1 %
HCT: 46.3 % (ref 39.0–52.0)
Hemoglobin: 15.7 g/dL (ref 13.0–17.0)
Immature Granulocytes: 1 %
Lymphocytes Relative: 24 %
Lymphs Abs: 1.9 10*3/uL (ref 0.7–4.0)
MCH: 30 pg (ref 26.0–34.0)
MCHC: 33.9 g/dL (ref 30.0–36.0)
MCV: 88.5 fL (ref 80.0–100.0)
Monocytes Absolute: 0.6 10*3/uL (ref 0.1–1.0)
Monocytes Relative: 8 %
Neutro Abs: 5.1 10*3/uL (ref 1.7–7.7)
Neutrophils Relative %: 65 %
Platelets: 292 10*3/uL (ref 150–400)
RBC: 5.23 MIL/uL (ref 4.22–5.81)
RDW: 12.9 % (ref 11.5–15.5)
WBC: 7.8 10*3/uL (ref 4.0–10.5)
nRBC: 0 % (ref 0.0–0.2)

## 2021-11-10 LAB — BASIC METABOLIC PANEL
Anion gap: 7 (ref 5–15)
BUN: 13 mg/dL (ref 6–20)
CO2: 26 mmol/L (ref 22–32)
Calcium: 8.9 mg/dL (ref 8.9–10.3)
Chloride: 108 mmol/L (ref 98–111)
Creatinine, Ser: 1.06 mg/dL (ref 0.61–1.24)
GFR, Estimated: >60 mL/min (ref >60)
Glucose, Bld: 95 mg/dL (ref 70–99)
Potassium: 4.4 mmol/L (ref 3.5–5.1)
Sodium: 141 mmol/L (ref 135–145)

## 2021-11-10 LAB — LACTIC ACID, PLASMA: Lactic Acid, Venous: 1.4 mmol/L (ref 0.5–1.9)

## 2021-11-10 NOTE — Discharge Instructions (Signed)
As discussed, your evaluation today has been largely reassuring.  But, it is important that you monitor your condition carefully, and do not hesitate to return to the ED if you develop new, or concerning changes in your condition. ? ?Otherwise, please follow-up with your physician for appropriate ongoing care. ? ?

## 2021-11-10 NOTE — ED Provider Notes (Signed)
[facility]-EMERGENCY DEPT Provider Note   CSN: [id number] Arrival date & time: 11/10/21  0759     History  Chief Complaint  Patient presents with   hand swelling    Eddie Stephenson is a 26 y.o. male.  HPI Eddie Stephenson is a 26 yo male with no significant medical history who presents to the ED with right hand swelling x 1 day. He reports waking up with his right hand swollen predominately on the palmar aspect. He denies pain but states that when he makes a fist he feels his palm may "burst". He does report hitting the posterior ulnar aspect of his right forearm yesterday. He denies any trauma to the hand, prior infection in the area, fever, chills, vomiting.  No systemic complaints.    Home Medications Prior to Admission medications   Not on File      Allergies    Patient has no known allergies.    Review of Systems   Review of Systems  All other systems reviewed and are negative.   Physical Exam Updated Vital Signs BP (!) 140/96 (BP Location: Left Arm)   Pulse 75   Temp 98.2 F (36.8 C) (Oral)   Resp 18   SpO2 99%  Physical Exam Vitals and nursing note reviewed.  Constitutional:      General: He is not in acute distress.    Appearance: He is well-developed.  HENT:     Head: Normocephalic and atraumatic.  Eyes:     Conjunctiva/sclera: Conjunctivae normal.  Cardiovascular:     Rate and Rhythm: Normal rate and regular rhythm.  Pulmonary:     Effort: Pulmonary effort is normal. No respiratory distress.     Breath sounds: No stridor.  Abdominal:     General: There is no distension.  Musculoskeletal:     Right elbow: Normal.       Arms:     Comments: Flexion and extension of the hand grossly unremarkable though with some limited secondary to edema which is more prominent on the palmar aspect.  Skin:    General: Skin is warm and dry.  Neurological:     Mental Status: He is alert and oriented to person, place, and time.     ED Results /  Procedures / Treatments   Labs (all labs ordered are listed, but only abnormal results are displayed) Labs Reviewed  BASIC METABOLIC PANEL  CBC WITH DIFFERENTIAL/PLATELET  LACTIC ACID, PLASMA    EKG None  Radiology DG Forearm Right  Result Date: 11/10/2021 CLINICAL DATA:  Ulnar aspect pain. Dorsal pain. Approximately 15 cm proximal to the wrist. EXAM: RIGHT FOREARM - 2 VIEW COMPARISON:  Right hand radiographs 11/10/2021, right elbow radiographs 04/15/2021 FINDINGS: Normal bone mineralization. The elbow and wrist joint spaces are maintained. The cortices are intact. No acute fracture or dislocation. A pen marks the area of the patient's pain at the dorsal ulnar aspect of the distal forearm. No bone or soft tissue abnormality is visualized in this region. IMPRESSION: Normal right forearm radiographs. Electronically Signed   By: Neita Garnet M.D.   On: 11/10/2021 09:08   DG Hand Complete Right  Result Date: 11/10/2021 CLINICAL DATA:  Hand swelling.  No known injury. EXAM: RIGHT HAND - COMPLETE 3+ VIEW COMPARISON:  None Available. FINDINGS: There is no acute fracture or dislocation. Bony alignment is normal. The joint spaces are preserved. There is no erosive change. There is mild diffuse soft tissue swelling. There is no soft tissue gas  or radiopaque foreign body. IMPRESSION: Mild diffuse soft tissue swelling. No acute osseous abnormality. No radiopaque foreign body or soft tissue gas. Electronically Signed   By: Lesia Hausen M.D.   On: 11/10/2021 08:27    Procedures Procedures    Medications Ordered in ED Medications - No data to display  ED Course/ Medical Decision Making/ A&P This patient with a Hx of no medical problems presents to the ED for concern of hand swelling and pain, this involves an extensive number of treatment options, and is a complaint that carries with it a high risk of complications and morbidity.    The differential diagnosis includes minor trauma with effusion, less  likely fracture, sprain, strain   Social Determinants of Health:  No limits    After the initial evaluation, orders, including: X-ray, labs were initiated.   I personally interpreted labs.  The pertinent results include: Unremarkable labs  Imaging Studies ordered:  I independently visualized and interpreted imaging which showed no fracture or foreign body identified in the hand and forearm x-ray I agree with the radiologist interpretation  Dispostion / Final MDM:  After consideration of the diagnostic results and the patient's response to treatment, this adult male presents with hand pain and swelling on the right.  Patient does have recall of minor trauma, with bumping a history of some suspicion for this with gravitational pull of effusion contributing to swelling, no obvious insect bite, nor cutaneous changes suggesting cellulitis, bacteremia.  Patient is preserved neurovascular distally, and is comfortable discharge with return precautions, follow-up instructions.  Final Clinical Impression(s) / ED Diagnoses Final diagnoses:  Hand pain, right     Gerhard Munch, MD 11/10/21 1025

## 2021-11-10 NOTE — ED Provider Triage Note (Signed)
Emergency Medicine Provider Triage Evaluation Note  Eddie Stephenson , a 26 y.o. male  was evaluated in triage.  Pt complains of swelling to right hand.  Patient reports waking up this morning with swelling to right hand.  Patient has no known injury.  Patient reports decree sensation to right hand.  Patient is right-hand dominant.  Review of Systems  Positive: Swelling to right hand Negative: Fever, chills, weakness, pallor, wound  Physical Exam  BP (!) 140/96 (BP Location: Left Arm)   Pulse 75   Temp 98.2 F (36.8 C) (Oral)   Resp 18   SpO2 99%  Gen:   Awake, no distress   Resp:  Normal effort  MSK:   Moves extremities without difficulty  Other:  Patient has diffuse swelling to dorsum and palmar aspect of right hand.  Full range of motion to all digits of right hand.  Sensation intact to all digits of right hand.  +2 radial pulse.  Medical Decision Making  Medically screening exam initiated at 8:14 AM.  Appropriate orders placed.  Constantine Ruddick was informed that the remainder of the evaluation will be completed by another provider, this initial triage assessment does not replace that evaluation, and the importance of remaining in the ED until their evaluation is complete.     Haskel Schroeder, [place] 11/10/21 [phone]

## 2021-11-10 NOTE — ED Triage Notes (Signed)
Pt arrives w/ c/o right hand swelling. Pt denies any injuries. Pt reports waking up with tightness & swelling in the hand.

## 2022-02-09 ENCOUNTER — Other Ambulatory Visit: Payer: Self-pay

## 2022-02-09 ENCOUNTER — Emergency Department (HOSPITAL_COMMUNITY)
Admission: EM | Admit: 2022-02-09 | Discharge: 2022-02-09 | Disposition: A | Discharge status: Home / Self Care | Payer: Self-pay | Attending: Emergency Medicine | Admitting: Emergency Medicine

## 2022-02-09 ENCOUNTER — Encounter (HOSPITAL_COMMUNITY): Payer: Self-pay

## 2022-02-09 DIAGNOSIS — M25512 Pain in left shoulder: Secondary | ICD-10-CM | POA: Insufficient documentation

## 2022-02-09 DIAGNOSIS — S40022A Contusion of left upper arm, initial encounter: Secondary | ICD-10-CM | POA: Insufficient documentation

## 2022-02-09 DIAGNOSIS — W208XXA Other cause of strike by thrown, projected or falling object, initial encounter: Secondary | ICD-10-CM | POA: Insufficient documentation

## 2022-02-09 DIAGNOSIS — Y9239 Other specified sports and athletic area as the place of occurrence of the external cause: Secondary | ICD-10-CM | POA: Insufficient documentation

## 2022-02-09 MED ORDER — IBUPROFEN 200 MG PO TABS
400.0000 mg | ORAL_TABLET | Freq: Once | ORAL | Status: AC
  Administered 2022-02-09: 400 mg via ORAL
  Filled 2022-02-09: qty 2

## 2022-02-09 NOTE — Discharge Instructions (Addendum)
Please refer to the attached instructions. Ice. Ibuprofen and/or tylenol for discomfort. Follow-up with orthopedics if no improvement.
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
## 2022-02-09 NOTE — ED Provider Notes (Signed)
[facility] DEPT Provider Note   CSN: [id number] Arrival date & time: 02/09/22  1928     History  Chief Complaint  Patient presents with   Shoulder Pain   Wrist Pain    Eddie Stephenson is a 26 y.o. male.  Patient presents with complaint of left biceps pain after weights dropped onto his upper arm and shoulder at the end of a lift. No bony tenderness or deformity. Bicep is painful to touch, but tissues are soft to palpation. Discomfort radiates to forearm and wrist. Full ROM at shoulder, elbow and wrist. No clavicular pain or deformity  The history is provided by the patient. No language interpreter was used.  Shoulder Pain Location:  Arm Arm location:  L arm Injury: yes   Pain details:    Quality:  Throbbing   Severity:  Moderate   Onset quality:  Sudden   Timing:  Constant   Progression:  Unchanged Handedness:  Right-handed Dislocation: no   Foreign body present:  No foreign bodies Prior injury to area:  No Worsened by:  Movement Ineffective treatments:  None tried      Home Medications Prior to Admission medications   Not on File      Allergies    Patient has no known allergies.    Review of Systems   Review of Systems  All other systems reviewed and are negative.   Physical Exam Updated Vital Signs BP (!) 133/101 (BP Location: Right Arm)   Pulse 92   Temp 98.5 F (36.9 C) (Oral)   Resp 19   Ht 6' (1.829 m)   Wt 88.9 kg   SpO2 96%   BMI 26.58 kg/m  Physical Exam Vitals and nursing note reviewed.  Constitutional:      Appearance: Normal appearance.  HENT:     Head: Atraumatic.  Eyes:     Conjunctiva/sclera: Conjunctivae normal.  Cardiovascular:     Rate and Rhythm: Normal rate and regular rhythm.     Pulses: Normal pulses.  Pulmonary:     Effort: Pulmonary effort is normal.  Musculoskeletal:        General: Tenderness present. No deformity.     Cervical back: Normal range of motion and neck supple.  Skin:     General: Skin is warm and dry.  Neurological:     General: No focal deficit present.     Mental Status: He is alert.  Psychiatric:        Mood and Affect: Mood normal.        Behavior: Behavior normal.     ED Results / Procedures / Treatments   Labs (all labs ordered are listed, but only abnormal results are displayed) Labs Reviewed - No data to display  EKG None  Radiology No results found.  Procedures Procedures    Medications Ordered in ED Medications  ibuprofen (ADVIL) tablet 400 mg (400 mg Oral Given 02/09/22 2042)    ED Course/ Medical Decision Making/ A&P                           Medical Decision Making Risk OTC drugs.   Patient history and exam consistent with muscle contusion of biceps. No indication of bony injury or tendon involvement.  Patient given sling while in ED, conservative therapy recommended and discussed. Pt advised to follow up with orthopedics. Patient will be discharged home & is agreeable with above plan. Returns precautions discussed. Pt appears safe  for discharge.         Final Clinical Impression(s) / ED Diagnoses Final diagnoses:  Contusion of left upper arm, initial encounter    Rx / DC Orders ED Discharge Orders     None         Felicie Morn, NP 02/09/22 2118    Arby Barrette, MD 02/10/22 1517

## 2022-03-10 ENCOUNTER — Emergency Department (HOSPITAL_COMMUNITY): Payer: No Typology Code available for payment source

## 2022-03-10 ENCOUNTER — Encounter (HOSPITAL_COMMUNITY): Payer: Self-pay

## 2022-03-10 ENCOUNTER — Other Ambulatory Visit: Payer: Self-pay

## 2022-03-10 ENCOUNTER — Emergency Department (HOSPITAL_COMMUNITY)
Admission: EM | Admit: 2022-03-10 | Discharge: 2022-03-11 | Disposition: A | Discharge status: Home / Self Care | Payer: No Typology Code available for payment source | Attending: Emergency Medicine | Admitting: Emergency Medicine

## 2022-03-10 DIAGNOSIS — Y92410 Unspecified street and highway as the place of occurrence of the external cause: Secondary | ICD-10-CM | POA: Diagnosis not present

## 2022-03-10 DIAGNOSIS — M25552 Pain in left hip: Secondary | ICD-10-CM | POA: Diagnosis present

## 2022-03-10 DIAGNOSIS — S39012A Strain of muscle, fascia and tendon of lower back, initial encounter: Secondary | ICD-10-CM | POA: Diagnosis not present

## 2022-03-10 MED ORDER — CYCLOBENZAPRINE HCL 5 MG PO TABS: 5.0000 mg | ORAL_TABLET | Freq: Two times a day (BID) | ORAL | 0 refills | Status: AC | PRN

## 2022-03-10 MED ORDER — CYCLOBENZAPRINE HCL 10 MG PO TABS
5.0000 mg | ORAL_TABLET | Freq: Once | ORAL | Status: AC
  Administered 2022-03-10: 5 mg via ORAL
  Filled 2022-03-10: qty 1

## 2022-03-10 MED ORDER — IBUPROFEN 600 MG PO TABS: 600.0000 mg | ORAL_TABLET | Freq: Four times a day (QID) | ORAL | 0 refills | Status: AC | PRN

## 2022-03-10 MED ORDER — KETOROLAC TROMETHAMINE 15 MG/ML IJ SOLN
15.0000 mg | Freq: Once | INTRAMUSCULAR | Status: AC
  Administered 2022-03-10: 15 mg via INTRAMUSCULAR
  Filled 2022-03-10: qty 1
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
## 2022-03-11 NOTE — ED Notes (Signed)
Patient has no concerns after AVS has been reviewed and patient education provided. Patient discharged.Patient has no concerns after AVS has been reviewed and patient education provided. Patient discharged. 

## 2022-03-24 ENCOUNTER — Emergency Department (HOSPITAL_COMMUNITY)
Admission: EM | Admit: 2022-03-24 | Discharge: 2022-03-25 | Discharge status: Against Medical Advice | Payer: Self-pay | Attending: Emergency Medicine | Admitting: Emergency Medicine

## 2022-03-24 ENCOUNTER — Encounter (HOSPITAL_COMMUNITY): Payer: Self-pay | Admitting: Emergency Medicine

## 2022-03-24 DIAGNOSIS — M79606 Pain in leg, unspecified: Secondary | ICD-10-CM | POA: Insufficient documentation

## 2022-03-24 DIAGNOSIS — M545 Low back pain, unspecified: Secondary | ICD-10-CM | POA: Insufficient documentation

## 2022-03-24 DIAGNOSIS — M791 Myalgia, unspecified site: Secondary | ICD-10-CM | POA: Insufficient documentation

## 2022-03-24 DIAGNOSIS — Z5321 Procedure and treatment not carried out due to patient leaving prior to being seen by health care provider: Secondary | ICD-10-CM | POA: Insufficient documentation

## 2022-03-24 NOTE — ED Triage Notes (Signed)
No answer  through out the  waiting room

## 2022-03-24 NOTE — ED Notes (Signed)
No answer
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
## 2022-07-13 IMAGING — CR DG ELBOW COMPLETE 3+V*R*
4 series · 4 of 4 positions shown · non-contrast
Comparison: None.

CLINICAL DATA: Right elbow pain.  History of fall.

EXAM:
RIGHT ELBOW - COMPLETE 3+ VIEW

[elbow ap]
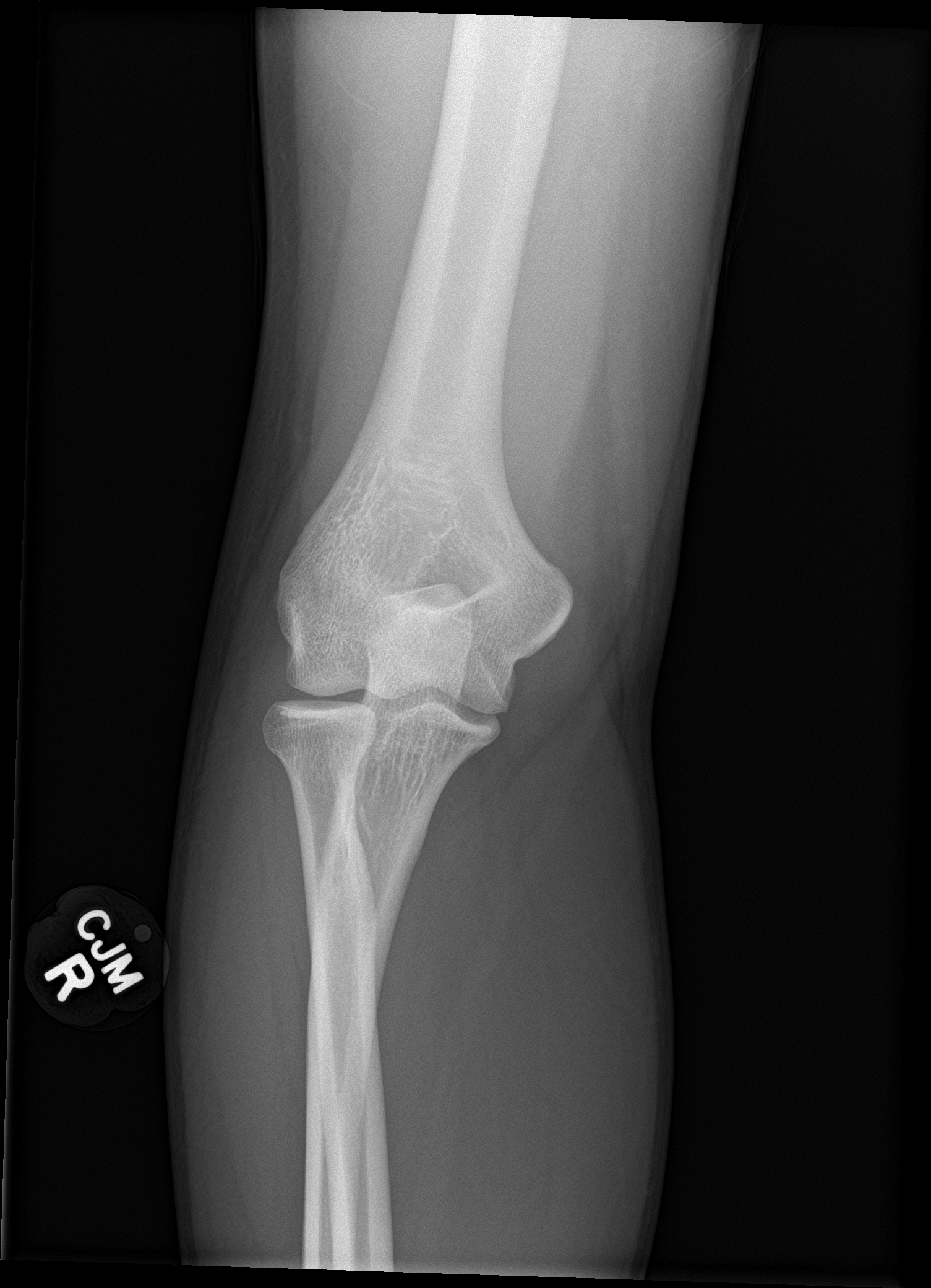

[elbow obl (1 of 2)]
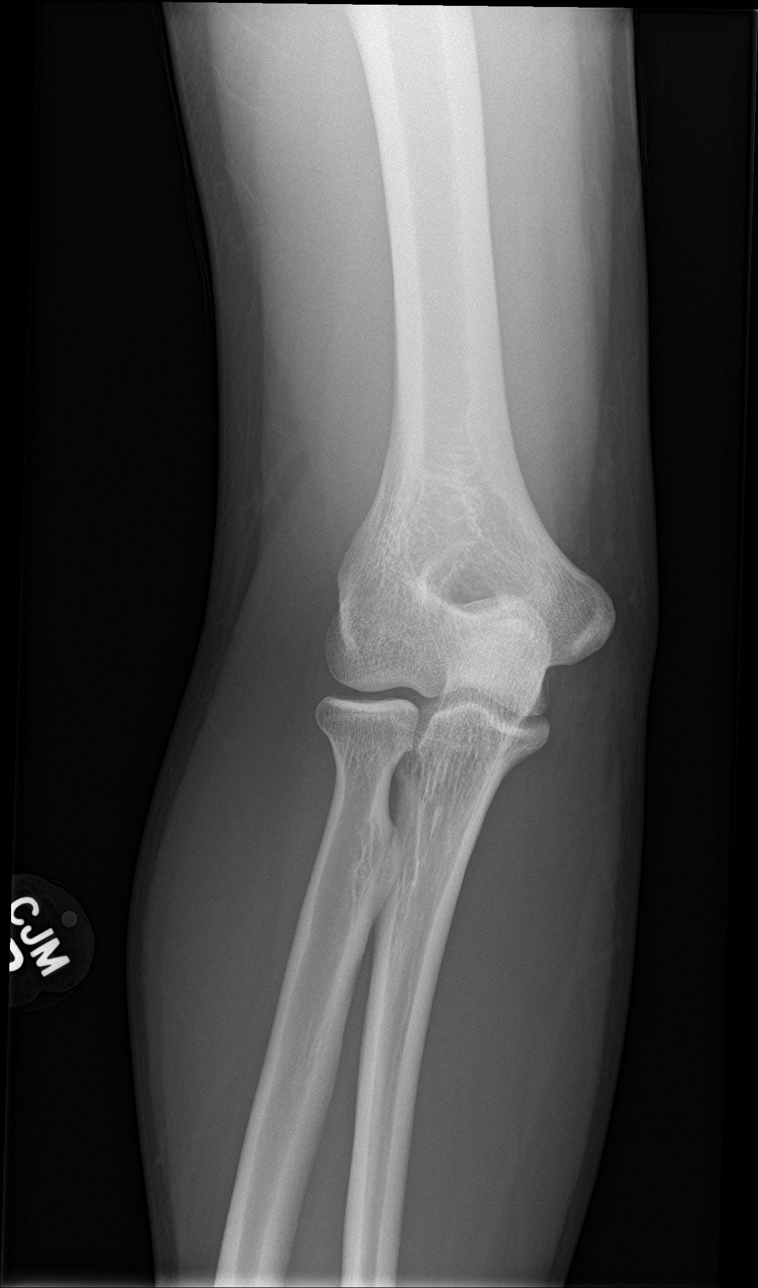

[elbow obl (2 of 2)]
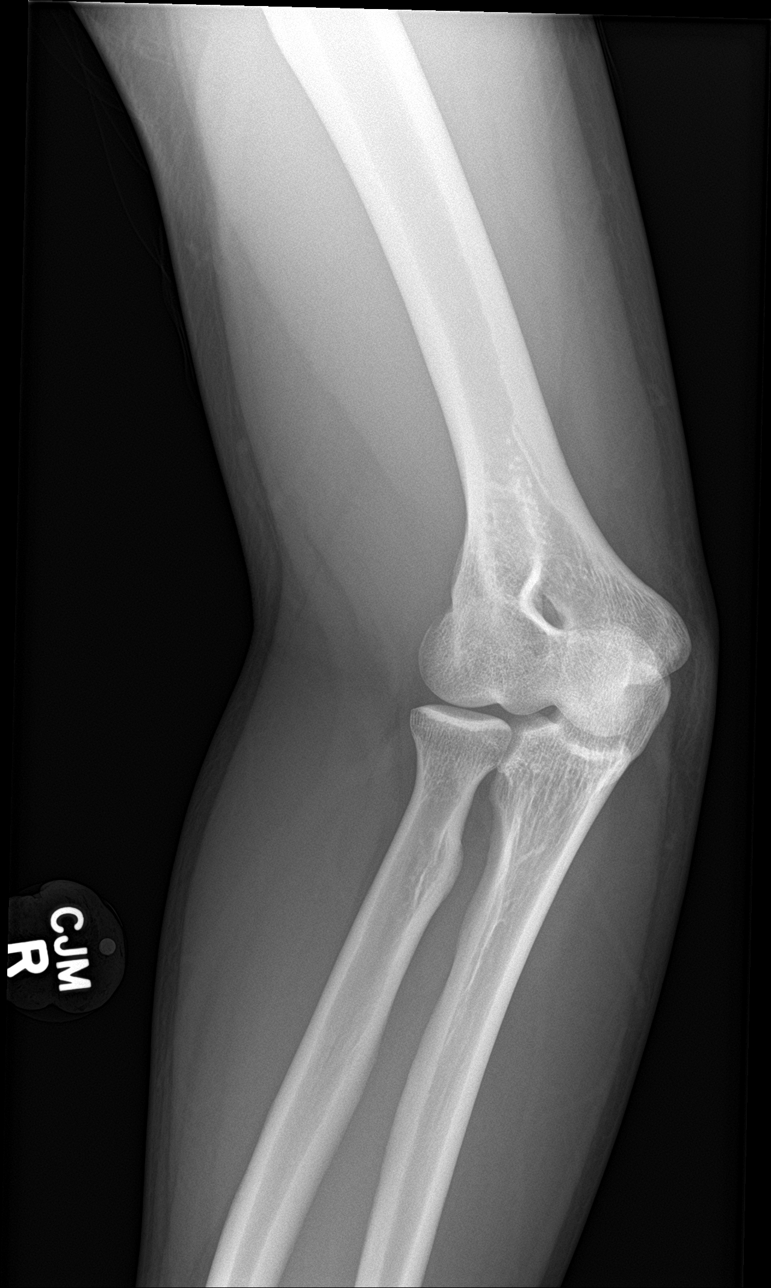

[elbow lat]
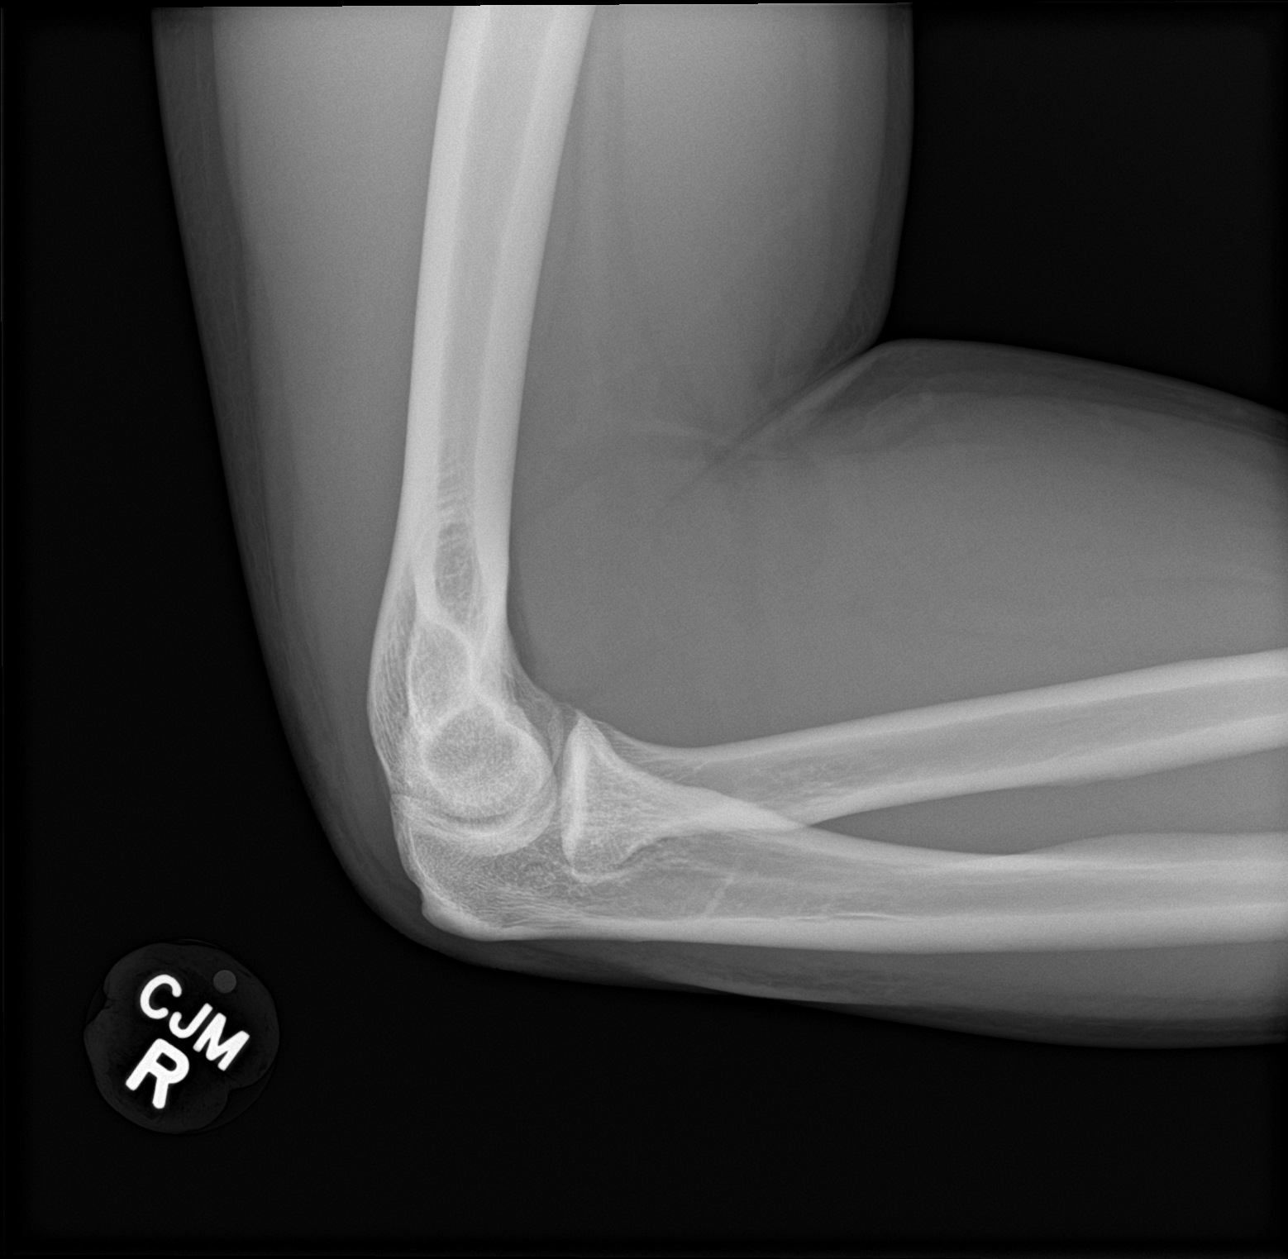

[4 of 4 positions shown; findings below may reference images not displayed]

FINDINGS: There is no evidence of fracture, dislocation, or joint effusion.
There is no evidence of arthropathy or other focal bone abnormality.
Soft tissues are unremarkable.
IMPRESSION: Negative.
